# Patient Record
Sex: Male | Born: 1955 | Race: White | Hispanic: No | Marital: Married | Smoking: Current every day smoker
Health system: Southern US, Community
[De-identification: ages and names within clinical notes are randomized; demographics above are authoritative.]

## PROBLEM LIST (undated history)

## (undated) DIAGNOSIS — F1010 Alcohol abuse, uncomplicated: Secondary | ICD-10-CM

## (undated) DIAGNOSIS — E785 Hyperlipidemia, unspecified: Secondary | ICD-10-CM

## (undated) DIAGNOSIS — I10 Essential (primary) hypertension: Secondary | ICD-10-CM

## (undated) HISTORY — DX: Essential (primary) hypertension: I10

## (undated) HISTORY — DX: Hyperlipidemia, unspecified: E78.5

---

## 2012-05-02 ENCOUNTER — Ambulatory Visit (INDEPENDENT_AMBULATORY_CARE_PROVIDER_SITE_OTHER): Payer: PRIVATE HEALTH INSURANCE | Admitting: Physician Assistant

## 2012-05-02 ENCOUNTER — Encounter: Payer: Self-pay | Admitting: Physician Assistant

## 2012-05-02 VITALS — BP 169/100 | HR 80 | Ht 68.5 in | Wt 176.0 lb

## 2012-05-02 DIAGNOSIS — Z1211 Encounter for screening for malignant neoplasm of colon: Secondary | ICD-10-CM

## 2012-05-02 DIAGNOSIS — E785 Hyperlipidemia, unspecified: Secondary | ICD-10-CM

## 2012-05-02 DIAGNOSIS — I10 Essential (primary) hypertension: Secondary | ICD-10-CM

## 2012-05-02 MED ORDER — LISINOPRIL 10 MG PO TABS: 10.0000 mg | ORAL_TABLET | Freq: Every day | ORAL | Status: DC

## 2012-05-02 MED ORDER — PRAVASTATIN SODIUM 20 MG PO TABS: 20.0000 mg | ORAL_TABLET | Freq: Every day | ORAL | Status: DC

## 2012-05-02 MED ORDER — AMLODIPINE BESYLATE 10 MG PO TABS: 10.0000 mg | ORAL_TABLET | Freq: Every day | ORAL | Status: DC

## 2012-05-02 NOTE — Patient Instructions (Addendum)
Will refill medication and recheck blood pressure with nurse visit in 2 weeks. Refer for colonoscopy.  1.5 Gram Low Sodium Diet A 1.5 gram sodium diet restricts the amount of sodium in the diet to no more than 1.5 g or 1500 mg daily. The American Heart Association recommends Americans over the age of 82 to consume no more than 1500 mg of sodium each day to reduce the risk of developing high blood pressure. Research also shows that limiting sodium may reduce heart attack and stroke risk. Many foods contain sodium for flavor and sometimes as a preservative. When the amount of sodium in a diet needs to be low, it is important to know what to look for when choosing foods and drinks. The following includes some information and guidelines to help make it easier for you to adapt to a low sodium diet. QUICK TIPS  Do not add salt to food.   Avoid convenience items and fast food.   Choose unsalted snack foods.   Buy lower sodium products, often labeled as "lower sodium" or "no salt added."   Check food labels to learn how much sodium is in 1 serving.   When eating at a restaurant, ask that your food be prepared with less salt or none, if possible.  READING FOOD LABELS FOR SODIUM INFORMATION The nutrition facts label is a good place to find how much sodium is in foods. Look for products with no more than 400 mg of sodium per serving. Remember that 1.5 g = 1500 mg. The food label may also list foods as:  Sodium-free: Less than 5 mg in a serving.   Very low sodium: 35 mg or less in a serving.   Low-sodium: 140 mg or less in a serving.   Light in sodium: 50% less sodium in a serving. For example, if a food that usually has 300 mg of sodium is changed to become light in sodium, it will have 150 mg of sodium.   Reduced sodium: 25% less sodium in a serving. For example, if a food that usually has 400 mg of sodium is changed to reduced sodium, it will have 300 mg of sodium.  CHOOSING  FOODS Grains  Avoid: Salted crackers and snack items. Some cereals, including instant hot cereals. Bread stuffing and biscuit mixes. Seasoned rice or pasta mixes.   Choose: Unsalted snack items. Low-sodium cereals, oats, puffed wheat and rice, shredded wheat. English muffins and bread. Pasta.  Meats  Avoid: Salted, canned, smoked, spiced, pickled meats, including fish and poultry. Bacon, ham, sausage, cold cuts, hot dogs, anchovies.   Choose: Low-sodium canned tuna and salmon. Fresh or frozen meat, poultry, and fish.  Dairy  Avoid: Processed cheese and spreads. Cottage cheese. Buttermilk and condensed milk. Regular cheese.   Choose: Milk. Low-sodium cottage cheese. Yogurt. Sour cream. Low-sodium cheese.  Fruits and Vegetables  Avoid: Regular canned vegetables. Regular canned tomato sauce and paste. Frozen vegetables in sauces. Olives. Rosita Fire. Relishes. Sauerkraut.   Choose: Low-sodium canned vegetables. Low-sodium tomato sauce and paste. Frozen or fresh vegetables. Fresh and frozen fruit.  Condiments  Avoid: Canned and packaged gravies. Worcestershire sauce. Tartar sauce. Barbecue sauce. Soy sauce. Steak sauce. Ketchup. Onion, garlic, and table salt. Meat flavorings and tenderizers.   Choose: Fresh and dried herbs and spices. Low-sodium varieties of mustard and ketchup. Lemon juice. Tabasco sauce. Horseradish.  SAMPLE 1.5 GRAM SODIUM MEAL PLAN Breakfast / Sodium (mg)  1 cup low-fat milk / 143 mg   1 whole-wheat English muffin /  240 mg   1 tbs heart-healthy margarine / 153 mg   1 hard-boiled egg / 139 mg   1 small orange / 0 mg  Lunch / Sodium (mg)  1 cup raw carrots / 76 mg   2 tbs no salt added peanut butter / 5 mg   2 slices whole-wheat bread / 270 mg   1 tbs jelly / 6 mg    cup red grapes / 2 mg  Dinner / Sodium (mg)  1 cup whole-wheat pasta / 2 mg   1 cup low-sodium tomato sauce / 73 mg   3 oz lean ground beef / 57 mg   1 small side salad (1 cup raw  spinach leaves,  cup cucumber,  cup yellow bell pepper) with 1 tsp olive oil and 1 tsp red wine vinegar / 25 mg  Snack / Sodium (mg)  1 container low-fat vanilla yogurt / 107 mg   3 graham cracker squares / 127 mg  Nutrient Analysis  Calories: 1745   Protein: 75 g   Carbohydrate: 237 g   Fat: 57 g   Sodium: 1425 mg  Document Released: 10/17/2005 Document Revised: 10/06/2011 Document Reviewed: 01/18/2010 [facility] Patient Information 2012 [place], [place].

## 2012-05-04 DIAGNOSIS — I10 Essential (primary) hypertension: Secondary | ICD-10-CM | POA: Insufficient documentation

## 2012-05-04 DIAGNOSIS — E785 Hyperlipidemia, unspecified: Secondary | ICD-10-CM | POA: Insufficient documentation

## 2012-05-04 NOTE — Progress Notes (Signed)
  Subjective:    Patient ID: Mario Gonzales, male    DOB: 1956/02/11, 56 y.o.   MRN: [id number]  HPI Patient presents to clinic to establish care. PMH was reviewed and + for HTN and Hyperlipidemia. Patient has not had medications in last 2-3 weeks. BP is very elevated today. Pt denies any CP, Palpitations, SOB, or HA's. He has not had cholesterol checked in over a year. Needs colonoscopy. NO other problems or concerns today. Needs CPE. Pt is a smoker 3/4 pak a day for 13 years.    Review of Systems     Objective:   Physical Exam  Constitutional: She is oriented to person, place, and time. She appears well-developed and well-nourished.  HENT:  Head: Normocephalic and atraumatic.  Cardiovascular: Normal rate, regular rhythm, normal heart sounds and intact distal pulses.   Pulmonary/Chest: Effort normal and breath sounds normal. She has no wheezes.  Neurological: She is alert and oriented to person, place, and time.  Skin: Skin is warm and dry.  Psychiatric: She has a normal mood and affect. Her behavior is normal.          Assessment & Plan:  HTN- REfilled BP meds today. Gave handout on low salt diet. Encouraged patient to increase exercise and maintain a healthy diet. Will recheck with nurse visit in 2 weeks for bp. Educated pt that stoping smoking could also help with HTN.  Hyperlipidemia- NOt been on Pravachol for a couple of weeks. Refilled Pravachol. Will recheck lipids at CPE in month or so. Discussed how stopping smoking could help decrease cholesterol.   Needs to schedule CPE. Referred for colonoscopy.

## 2012-05-18 ENCOUNTER — Ambulatory Visit (INDEPENDENT_AMBULATORY_CARE_PROVIDER_SITE_OTHER): Payer: PRIVATE HEALTH INSURANCE | Admitting: Physician Assistant

## 2012-05-18 VITALS — BP 130/79 | HR 73

## 2012-05-18 DIAGNOSIS — I10 Essential (primary) hypertension: Secondary | ICD-10-CM

## 2012-05-18 NOTE — Progress Notes (Signed)
  Subjective:    Patient ID: Mario Gonzales, male    DOB: [birth year], 56 y.o.   MRN: [id number] BP check. No c/o SOB, chest pain or headache. Tolerating meds HPI    Review of Systems     Objective:   Physical Exam        Assessment & Plan:  Hypertension- Looking good. Follow up in 6 months. Continue on all meds. Tandy Gaw PA-C

## 2012-05-18 NOTE — Patient Instructions (Signed)
Continue on all meds. Follow up in 6 months.

## 2012-06-25 ENCOUNTER — Other Ambulatory Visit: Payer: Self-pay | Admitting: Physician Assistant

## 2012-08-29 ENCOUNTER — Other Ambulatory Visit: Payer: Self-pay | Admitting: Family Medicine

## 2012-08-29 ENCOUNTER — Other Ambulatory Visit: Payer: Self-pay | Admitting: Physician Assistant

## 2012-10-05 ENCOUNTER — Other Ambulatory Visit: Payer: Self-pay | Admitting: Physician Assistant

## 2012-12-10 ENCOUNTER — Other Ambulatory Visit: Payer: Self-pay | Admitting: Physician Assistant

## 2012-12-12 ENCOUNTER — Ambulatory Visit: Payer: PRIVATE HEALTH INSURANCE | Admitting: Physician Assistant

## 2012-12-14 ENCOUNTER — Encounter: Payer: Self-pay | Admitting: Physician Assistant

## 2012-12-14 ENCOUNTER — Ambulatory Visit (INDEPENDENT_AMBULATORY_CARE_PROVIDER_SITE_OTHER): Payer: PRIVATE HEALTH INSURANCE | Admitting: Physician Assistant

## 2012-12-14 VITALS — BP 142/84 | HR 88 | Wt 175.0 lb

## 2012-12-14 DIAGNOSIS — I10 Essential (primary) hypertension: Secondary | ICD-10-CM

## 2012-12-14 DIAGNOSIS — Z23 Encounter for immunization: Secondary | ICD-10-CM

## 2012-12-14 DIAGNOSIS — Z79899 Other long term (current) drug therapy: Secondary | ICD-10-CM

## 2012-12-14 DIAGNOSIS — E785 Hyperlipidemia, unspecified: Secondary | ICD-10-CM

## 2012-12-14 MED ORDER — AMLODIPINE BESYLATE 10 MG PO TABS: 10.0000 mg | ORAL_TABLET | Freq: Every day | ORAL | Status: DC

## 2012-12-14 MED ORDER — LISINOPRIL 10 MG PO TABS: 10.0000 mg | ORAL_TABLET | Freq: Every day | ORAL | Status: DC

## 2012-12-14 MED ORDER — PRAVASTATIN SODIUM 20 MG PO TABS: 20.0000 mg | ORAL_TABLET | Freq: Every day | ORAL | Status: DC

## 2012-12-14 NOTE — Progress Notes (Signed)
  Subjective:    Patient ID: Mario Gonzales, male    DOB: 1955-12-14, 57 y.o.   MRN: [id number]  Hypertension This is a chronic problem. The current episode started more than 1 year ago. The problem has been resolved since onset. The problem is controlled. Pertinent negatives include no anxiety, blurred vision, chest pain, headaches, malaise/fatigue, neck pain, orthopnea, palpitations, peripheral edema, PND, shortness of breath or sweats. Risk factors for coronary artery disease include dyslipidemia, male gender, smoking/tobacco exposure and sedentary lifestyle. Past treatments include ACE inhibitors and calcium channel blockers. The current treatment provides significant improvement. There are no compliance problems.  There is no history of angina, kidney disease, CAD/MI or heart failure. There is no history of chronic renal disease.  Hyperlipidemia This is a chronic problem. The current episode started more than 1 year ago. Condition status: Unknown no recent labs. Lipid results: Needs labs. He has no history of chronic renal disease, diabetes, hypothyroidism or obesity. Factors aggravating his hyperlipidemia include smoking. Pertinent negatives include no chest pain, focal sensory loss, focal weakness, leg pain, myalgias or shortness of breath. Current antihyperlipidemic treatment includes statins. The current treatment provides significant improvement of lipids. Compliance problems include adherence to diet and adherence to exercise.  Risk factors for coronary artery disease include dyslipidemia, male sex, hypertension and a sedentary lifestyle.      Review of Systems  Constitutional: Negative for malaise/fatigue.  HENT: Negative for neck pain.   Eyes: Negative for blurred vision.  Respiratory: Negative for shortness of breath.   Cardiovascular: Negative for chest pain, palpitations, orthopnea and PND.  Musculoskeletal: Negative for myalgias.  Neurological: Negative for focal weakness and  headaches.       Objective:   Physical Exam  Constitutional: He is oriented to person, place, and time. He appears well-developed and well-nourished.  HENT:  Head: Normocephalic and atraumatic.  Cardiovascular: Normal rate, regular rhythm and normal heart sounds.   Pulmonary/Chest: Effort normal and breath sounds normal. He has no wheezes.  Neurological: He is alert and oriented to person, place, and time. He has normal reflexes.  Skin: Skin is warm and dry.  NO swelling of extremities.   Psychiatric: He has a normal mood and affect. His behavior is normal.          Assessment & Plan:  Hypertension-Need to check CMP. Refilled meds for 6 months. Reminded of low salt diet.   Hyperlipidemia-Need to recheck cholesterol.Jovita Gamma lab slip. Refilled meds for 6 months.    Tdap given without any complications. Discussed with patient need for colonoscopy. Pt will check with insurance.

## 2012-12-14 NOTE — Patient Instructions (Signed)
Need labs drawn. Will call with results.   Follow up in 6 months.   Find out about colonoscopy if will go i will referral.

## 2012-12-28 LAB — COMPLETE METABOLIC PANEL WITH GFR
ALT: 22 U/L (ref 0–53)
AST: 28 U/L (ref 0–37)
Albumin: 4.7 g/dL (ref 3.5–5.2)
Calcium: 9.3 mg/dL (ref 8.4–10.5)
Chloride: 105 mEq/L (ref 96–112)
Creat: 0.78 mg/dL (ref 0.50–1.35)
Potassium: 4.5 mEq/L (ref 3.5–5.3)
Sodium: 140 mEq/L (ref 135–145)
Total Protein: 7.3 g/dL (ref 6.0–8.3)

## 2012-12-28 LAB — LIPID PANEL
LDL Cholesterol: 103 mg/dL — ABNORMAL HIGH (ref 0–99)
Triglycerides: 95 mg/dL (ref <150)
VLDL: 19 mg/dL (ref 0–40)

## 2013-05-10 ENCOUNTER — Ambulatory Visit (INDEPENDENT_AMBULATORY_CARE_PROVIDER_SITE_OTHER): Payer: PRIVATE HEALTH INSURANCE

## 2013-05-10 ENCOUNTER — Encounter: Payer: Self-pay | Admitting: Physician Assistant

## 2013-05-10 ENCOUNTER — Ambulatory Visit (INDEPENDENT_AMBULATORY_CARE_PROVIDER_SITE_OTHER): Payer: PRIVATE HEALTH INSURANCE | Admitting: Physician Assistant

## 2013-05-10 VITALS — BP 127/74 | HR 77 | Wt 174.0 lb

## 2013-05-10 DIAGNOSIS — M542 Cervicalgia: Secondary | ICD-10-CM

## 2013-05-10 DIAGNOSIS — M5030 Other cervical disc degeneration, unspecified cervical region: Secondary | ICD-10-CM

## 2013-05-10 NOTE — Progress Notes (Signed)
  Subjective:    Patient ID: Mario Gonzales, male    DOB: Jan 28, 1956, 57 y.o.   MRN: [id number]  HPI Patient presents to the clinic with 5-6 months of right sided neck pain. Denies any known trauma. Feels like pain and stiffness is getting worse. He rates the pain 1-2 out of 10. Describes the pain at dull and achy. Made worst with neck movement to the right. No pain with touching neck. He has not tried anything to make better. Denies any numbness or tingling down arm. He does have an occasional headache. No fever, chills.      Review of Systems     Objective:   Physical Exam  Constitutional: He appears well-developed and well-nourished.  Musculoskeletal:  No pain with palpation over C-spine. ROM of neck is normal with pain when turn head to the right. Strength 5/5. Normal ROM at shoulder without pain.          Assessment & Plan:  Right sided neck pain- suspect pulled muscle and inflammation and maybe some degenerative changes. Will get xrays of neck. Gave handout of stretches and discussed regular icing. Did give samples of celebrex for one week then use ibuprofen for neck pain. If not improving in next 2 weeks could consider muscle relaxer or formal PT. Call if not improving.

## 2013-06-20 ENCOUNTER — Encounter: Payer: Self-pay | Admitting: *Deleted

## 2013-06-26 ENCOUNTER — Telehealth: Payer: Self-pay | Admitting: Physician Assistant

## 2013-06-26 NOTE — Telephone Encounter (Signed)
Pt called and left a message for someone to call him back about his test results and meds

## 2013-06-27 ENCOUNTER — Other Ambulatory Visit: Payer: Self-pay | Admitting: Physician Assistant

## 2013-06-27 ENCOUNTER — Telehealth: Payer: Self-pay | Admitting: *Deleted

## 2013-06-27 MED ORDER — MELOXICAM 15 MG PO TABS: 15.0000 mg | ORAL_TABLET | Freq: Every day | ORAL | Status: DC

## 2013-06-27 NOTE — Telephone Encounter (Signed)
Pt calls and states that you gave him Celebrex for his neck pain and would like a prescription sent to [facility] in [place]. Barry Dienes, LPN

## 2013-06-27 NOTE — Telephone Encounter (Signed)
I don't think insurance will pay for celebrex unless failed mobic. I will send to pharmacy. See if this works as well. It is only once a day. ONly use as needed.

## 2013-06-28 NOTE — Telephone Encounter (Signed)
Pt notified of med change and instructions. Barry Dienes, LPN

## 2013-07-10 ENCOUNTER — Other Ambulatory Visit: Payer: Self-pay | Admitting: Physician Assistant

## 2013-07-12 ENCOUNTER — Other Ambulatory Visit: Payer: Self-pay | Admitting: Physician Assistant

## 2013-08-28 ENCOUNTER — Other Ambulatory Visit: Payer: Self-pay | Admitting: Physician Assistant

## 2013-10-14 ENCOUNTER — Other Ambulatory Visit: Payer: Self-pay | Admitting: *Deleted

## 2013-10-14 MED ORDER — AMLODIPINE BESYLATE 10 MG PO TABS: ORAL_TABLET | ORAL | Status: DC

## 2013-10-14 MED ORDER — PRAVASTATIN SODIUM 20 MG PO TABS: ORAL_TABLET | ORAL | Status: DC

## 2013-10-14 MED ORDER — LISINOPRIL 10 MG PO TABS: ORAL_TABLET | ORAL | Status: DC

## 2013-11-20 ENCOUNTER — Other Ambulatory Visit: Payer: Self-pay | Admitting: Physician Assistant

## 2013-12-31 ENCOUNTER — Other Ambulatory Visit: Payer: Self-pay | Admitting: *Deleted

## 2013-12-31 MED ORDER — AMLODIPINE BESYLATE 10 MG PO TABS: ORAL_TABLET | ORAL | Status: DC

## 2013-12-31 MED ORDER — PRAVASTATIN SODIUM 20 MG PO TABS: ORAL_TABLET | ORAL | Status: DC

## 2013-12-31 MED ORDER — LISINOPRIL 10 MG PO TABS: ORAL_TABLET | ORAL | Status: DC

## 2014-02-05 ENCOUNTER — Other Ambulatory Visit: Payer: Self-pay | Admitting: Physician Assistant

## 2014-04-11 ENCOUNTER — Ambulatory Visit (INDEPENDENT_AMBULATORY_CARE_PROVIDER_SITE_OTHER): Payer: PRIVATE HEALTH INSURANCE | Admitting: Physician Assistant

## 2014-04-11 ENCOUNTER — Encounter: Payer: Self-pay | Admitting: Physician Assistant

## 2014-04-11 VITALS — BP 152/93 | HR 76 | Ht 68.5 in | Wt 174.0 lb

## 2014-04-11 DIAGNOSIS — F17200 Nicotine dependence, unspecified, uncomplicated: Secondary | ICD-10-CM

## 2014-04-11 DIAGNOSIS — F1010 Alcohol abuse, uncomplicated: Secondary | ICD-10-CM

## 2014-04-11 DIAGNOSIS — I10 Essential (primary) hypertension: Secondary | ICD-10-CM

## 2014-04-11 DIAGNOSIS — Z720 Tobacco use: Secondary | ICD-10-CM

## 2014-04-11 DIAGNOSIS — E785 Hyperlipidemia, unspecified: Secondary | ICD-10-CM

## 2014-04-11 MED ORDER — AMLODIPINE BESYLATE 10 MG PO TABS: ORAL_TABLET | ORAL | Status: DC

## 2014-04-11 MED ORDER — PRAVASTATIN SODIUM 20 MG PO TABS: ORAL_TABLET | ORAL | Status: DC

## 2014-04-14 DIAGNOSIS — F1010 Alcohol abuse, uncomplicated: Secondary | ICD-10-CM | POA: Insufficient documentation

## 2014-04-14 DIAGNOSIS — Z720 Tobacco use: Secondary | ICD-10-CM | POA: Insufficient documentation

## 2014-04-14 NOTE — Progress Notes (Signed)
   Subjective:    Patient ID: Mario Gonzales, male    DOB: 11-Oct-1956, 58 y.o.   MRN: [id number]  HPI Patient is a 58 year old male who presents to the clinic to start back on his medications. She's not been on any medications for the last 3 months. He was laid off and could not afford his medications were office visits. He denies any chest pains, palpitations, headache or vision changes. He has not been checking his blood pressure regularly. He continues to smoke three fourths of a pack daily. He has no intentions of quitting. He does admit to drinking every night.    Review of Systems  All other systems reviewed and are negative.      Objective:   Physical Exam  Constitutional: He is oriented to person, place, and time. He appears well-developed and well-nourished.  HENT:  Head: Normocephalic and atraumatic.  Cardiovascular: Normal rate, regular rhythm and normal heart sounds.   Pulmonary/Chest: Effort normal and breath sounds normal.  Neurological: He is alert and oriented to person, place, and time.  Psychiatric: He has a normal mood and affect. His behavior is normal.          Assessment & Plan:  Hypertension-obviously blood pressure would be elevated today. He just started back on one medication to make sure would not be effective. Patient was started back on Norvasc 10 mg daily. Recheck in 4 weeks for blood pressure as well as addition of other medications to control.  Hyperlipidemia-started patient back on Pravachol 20 mg. It would not make any sense to check levels today since patient has not been on medication. Discuss the next 3 months should check lipid panel.  Tobacco abuse-discuss smoking and harmful effects. Patient has no intention or motivation to quit smoking.  Alcohol abuse/depression-patient does admit to drinking 2-3 glasses of bourbon every night. He has done this since his daughter died in 02/25/11. He continues to work and hold down a job. Discussed counseling  and patient was not interested. He does not want to be on medication at this time. Will check cmp at next visit.   Encouraged patient to schedule a complete physical.   Discussed need for colonoscopy. Patient is fair adamant he does not want colonoscopy. Discussed stool cards and patient said he would consider. Stool cards given today.

## 2014-07-09 ENCOUNTER — Other Ambulatory Visit: Payer: Self-pay | Admitting: Physician Assistant

## 2014-07-09 ENCOUNTER — Emergency Department (HOSPITAL_BASED_OUTPATIENT_CLINIC_OR_DEPARTMENT_OTHER)
Admission: EM | Admit: 2014-07-09 | Discharge: 2014-07-10 | Disposition: A | Discharge status: Home / Self Care | Payer: PRIVATE HEALTH INSURANCE | Attending: Emergency Medicine | Admitting: Emergency Medicine

## 2014-07-09 ENCOUNTER — Encounter (HOSPITAL_BASED_OUTPATIENT_CLINIC_OR_DEPARTMENT_OTHER): Payer: Self-pay | Admitting: Emergency Medicine

## 2014-07-09 ENCOUNTER — Emergency Department (HOSPITAL_BASED_OUTPATIENT_CLINIC_OR_DEPARTMENT_OTHER): Payer: PRIVATE HEALTH INSURANCE

## 2014-07-09 DIAGNOSIS — E785 Hyperlipidemia, unspecified: Secondary | ICD-10-CM | POA: Insufficient documentation

## 2014-07-09 DIAGNOSIS — Z79899 Other long term (current) drug therapy: Secondary | ICD-10-CM | POA: Diagnosis not present

## 2014-07-09 DIAGNOSIS — F17200 Nicotine dependence, unspecified, uncomplicated: Secondary | ICD-10-CM | POA: Insufficient documentation

## 2014-07-09 DIAGNOSIS — R319 Hematuria, unspecified: Secondary | ICD-10-CM | POA: Diagnosis present

## 2014-07-09 DIAGNOSIS — I10 Essential (primary) hypertension: Secondary | ICD-10-CM | POA: Insufficient documentation

## 2014-07-09 DIAGNOSIS — R310 Gross hematuria: Secondary | ICD-10-CM | POA: Insufficient documentation

## 2014-07-09 HISTORY — DX: Alcohol abuse, uncomplicated: F10.10

## 2014-07-09 LAB — URINALYSIS, ROUTINE W REFLEX MICROSCOPIC
BILIRUBIN URINE: NEGATIVE
GLUCOSE, UA: NEGATIVE mg/dL
KETONES UR: 15 mg/dL — AB
Nitrite: NEGATIVE
PH: 7 (ref 5.0–8.0)
Protein, ur: 30 mg/dL — AB
Specific Gravity, Urine: 1.005 (ref 1.005–1.030)
Urobilinogen, UA: 1 mg/dL (ref 0.0–1.0)

## 2014-07-09 LAB — URINE MICROSCOPIC-ADD ON

## 2014-07-09 NOTE — ED Notes (Signed)
MD at bedside. 

## 2014-07-09 NOTE — ED Notes (Signed)
C/o dark urine x 3 days-today obvious blood in urine

## 2014-07-09 NOTE — ED Provider Notes (Signed)
CSN: [id number]     Arrival date & time 07/09/14  2128 History  This chart was scribed for Wynetta Fines, MD by Jeanell Sparrow, ED Scribe. This patient was seen in room MH05/MH05 and the patient's care was started at 11:17 PM.   Chief Complaint  Patient presents with  . Hematuria   The history is provided by the patient. No language interpreter was used.   HPI Comments: Mario Gonzales is a 58 y.o. male who presents to the Emergency Department complaining of hematuria. He states that his urine has been dark the last few days. He reports that this morning he noticed gross blood in his urine. He denies any pain, fever, chills, nausea, or emesis. The symptoms are somewhat improved with increased hydration.   Past Medical History  Diagnosis Date  . Hypertension   . Hyperlipidemia   . ETOH abuse    History reviewed. No pertinent past surgical history. Family History  Problem Relation Age of Onset  . Heart attack Daughter   . Stroke Daughter    History  Substance Use Topics  . Smoking status: Current Every Day Smoker -- 0.80 packs/day    Types: Cigarettes  . Smokeless tobacco: Not on file  . Alcohol Use: Yes     Comment: daily    Review of Systems A complete 10 system review of systems was obtained and all systems are negative except as noted in the HPI and PMH.   Allergies  Mobic  Home Medications   Prior to Admission medications   Medication Sig Start Date End Date Taking? Authorizing Provider  amLODipine (NORVASC) 10 MG tablet TAKE ONE TABLET BY MOUTH ONCE DAILY 07/09/14   Jade L Breeback, PA-C  pravastatin (PRAVACHOL) 20 MG tablet TAKE ONE TABLET BY MOUTH ONCE DAILY 04/11/14   Jade L Breeback, PA-C   BP 160/87  Pulse 85  Temp(Src) 98.4 F (36.9 C) (Oral)  Resp 18  Ht 5\' 10"  (1.778 m)  Wt 175 lb (79.379 kg)  BMI 25.11 kg/m2  SpO2 98% Physical Exam  Nursing note and vitals reviewed.  General: Well-developed, well-nourished male in no acute distress; appearance  consistent with age of record HENT: normocephalic; atraumatic Eyes: pupils equal, round and reactive to light; extraocular muscles intact Neck: supple Heart: regular rate and rhythm Lungs: clear to auscultation bilaterally Abdomen: soft; nondistended; nontender; no masses or hepatosplenomegaly; bowel sounds present GU: no CVA tenderness Extremities: No deformity; full range of motion; pulses normal Neurologic: Awake, alert and oriented; motor function intact in all extremities and symmetric; no facial droop Skin: Warm and dry Psychiatric: Normal mood and affect  ED Course  Procedures (including critical care time) DIAGNOSTIC STUDIES: Oxygen Saturation is 98% on RA, normal by my interpretation.    COORDINATION OF CARE: 11:21 PM- Pt advised of plan for treatment which includes labs and pt agrees.  Nursing notes and vitals signs, including pulse oximetry, reviewed.  Summary of this visit's results, reviewed by myself:  Labs:  Results for orders placed during the hospital encounter of 07/09/14 (from the past 24 hour(s))  URINALYSIS, ROUTINE W REFLEX MICROSCOPIC     Status: Abnormal   Collection Time    07/09/14  9:47 PM      Result Value Ref Range   Color, Urine RED (*) YELLOW   APPearance CLEAR  CLEAR   Specific Gravity, Urine 1.005  1.005 - 1.030   pH 7.0  5.0 - 8.0   Glucose, UA NEGATIVE  NEGATIVE mg/dL  Hgb urine dipstick LARGE (*) NEGATIVE   Bilirubin Urine NEGATIVE  NEGATIVE   Ketones, ur 15 (*) NEGATIVE mg/dL   Protein, ur 30 (*) NEGATIVE mg/dL   Urobilinogen, UA 1.0  0.0 - 1.0 mg/dL   Nitrite NEGATIVE  NEGATIVE   Leukocytes, UA TRACE (*) NEGATIVE  URINE MICROSCOPIC-ADD ON     Status: None   Collection Time    07/09/14  9:47 PM      Result Value Ref Range   Squamous Epithelial / LPF RARE  RARE   WBC, UA 0-2  <3 WBC/hpf   RBC / HPF TOO NUMEROUS TO COUNT  <3 RBC/hpf   Bacteria, UA RARE  RARE    Imaging Studies: Ct Abdomen Pelvis Wo Contrast  07/10/2014    CLINICAL DATA:  Painless hematuria  EXAM: CT ABDOMEN AND PELVIS WITHOUT CONTRAST  TECHNIQUE: Multidetector CT imaging of the abdomen and pelvis was performed following the standard protocol without IV contrast.  COMPARISON:  None.  FINDINGS: Minimal scarring cyst atelectasis lung bases.  Unenhanced liver, spleen, pancreas, and adrenal glands are within normal limits.  Gallbladder is underdistended. No intrahepatic or extrahepatic ductal dilatation.  6 mm posterior interpolar right renal calculus (series 2/ image 35). Left kidney is unremarkable. No hydronephrosis.  No evidence of bowel obstruction. Normal appendix. Extensive sigmoid diverticulosis, without associated inflammatory changes.  Atherosclerotic calcifications of the abdominal aorta and branch vessels.  No abdominopelvic ascites.  No suspicious abdominopelvic lymphadenopathy.  Prostate is unremarkable.  No ureteral or bladder calculi.  Bladder is mildly thick-walled but underdistended.  Degenerative changes of the visualized thoracolumbar spine.  IMPRESSION: 6 mm posterior interpolar right renal calculus.  No ureteral or bladder calculi.  No hydronephrosis.   Electronically Signed   By: Julian Hy M.D.   On: 07/10/2014 00:04   Patient and family advised of lab and x-ray findings. He was advised that because the cause of the hematuria is not obvious, but includes cancer, he will need followup with urology for further workup  I personally performed the services described in this documentation, which was scribed in my presence. The recorded information has been reviewed and is accurate.   Wynetta Fines, MD 07/10/14 [phone]

## 2014-07-10 NOTE — Discharge Instructions (Signed)
Hematuria Hematuria is blood in your urine. It can be caused by a bladder infection, kidney infection, prostate infection, kidney stone, or cancer of your urinary tract. Infections can usually be treated with medicine, and a kidney stone usually will pass through your urine. If neither of these is the cause of your hematuria, further workup to find out the reason may be needed. It is very important that you tell your health care provider about any blood you see in your urine, even if the blood stops without treatment or happens without causing pain. Blood in your urine that happens and then stops and then happens again can be a symptom of a very serious condition. Also, pain is not a symptom in the initial stages of many urinary cancers. HOME CARE INSTRUCTIONS   Drink lots of fluid, 3-4 quarts a day. If you have been diagnosed with an infection, cranberry juice is especially recommended, in addition to large amounts of water.  Avoid caffeine, tea, and carbonated beverages because they tend to irritate the bladder.  Avoid alcohol because it may irritate the prostate.  Take all medicines as directed by your health care provider.  If you were prescribed an antibiotic medicine, finish it all even if you start to feel better.  If you have been diagnosed with a kidney stone, follow your health care provider's instructions regarding straining your urine to catch the stone.  Empty your bladder often. Avoid holding urine for long periods of time. SEEK MEDICAL CARE IF:  You develop back pain.  You have a fever.  You have a feeling of sickness in your stomach (nausea) or vomiting.  You are unable to urinate. MAKE SURE YOU:   Understand these instructions.  Will watch your condition.  Will get help right away if you are not doing well or get worse. Document Released: 10/17/2005 Document Revised: 03/03/2014 Document Reviewed: 06/17/2013 [facility] Patient Information 2015 [place], [place]. This  information is not intended to replace advice given to you by your health care provider. Make sure you discuss any questions you have with your health care provider.

## 2014-07-11 LAB — URINE CULTURE
COLONY COUNT: NO GROWTH
Culture: NO GROWTH

## 2014-08-04 ENCOUNTER — Other Ambulatory Visit: Payer: Self-pay | Admitting: Physician Assistant

## 2014-10-11 ENCOUNTER — Other Ambulatory Visit: Payer: Self-pay | Admitting: Physician Assistant

## 2014-12-06 ENCOUNTER — Other Ambulatory Visit: Payer: Self-pay | Admitting: Physician Assistant

## 2014-12-16 ENCOUNTER — Encounter: Payer: Self-pay | Admitting: Physician Assistant

## 2014-12-16 ENCOUNTER — Ambulatory Visit (INDEPENDENT_AMBULATORY_CARE_PROVIDER_SITE_OTHER): Payer: PRIVATE HEALTH INSURANCE | Admitting: Physician Assistant

## 2014-12-16 VITALS — BP 172/91 | HR 127 | Ht 70.0 in | Wt 174.0 lb

## 2014-12-16 DIAGNOSIS — Z79899 Other long term (current) drug therapy: Secondary | ICD-10-CM

## 2014-12-16 DIAGNOSIS — I10 Essential (primary) hypertension: Secondary | ICD-10-CM

## 2014-12-16 DIAGNOSIS — Z720 Tobacco use: Secondary | ICD-10-CM

## 2014-12-16 DIAGNOSIS — E785 Hyperlipidemia, unspecified: Secondary | ICD-10-CM

## 2014-12-16 MED ORDER — AMLODIPINE BESYLATE 10 MG PO TABS: 10.0000 mg | ORAL_TABLET | Freq: Every day | ORAL | Status: DC

## 2014-12-16 MED ORDER — PRAVASTATIN SODIUM 20 MG PO TABS: ORAL_TABLET | ORAL | Status: DC

## 2014-12-16 NOTE — Progress Notes (Signed)
   Subjective:    Patient ID: Mario Gonzales, male    DOB: Jan 14, 1956, 59 y.o.   MRN: [id number]  HPI Pt is a 59 yo male who presents to the clinic to get back on blood pressure and cholesterol medications. He has not been on due to not having insurance and not able to come in and get office visit. He denies any CP, palpitations, headaches, or vision changes.    Review of Systems  All other systems reviewed and are negative.      Objective:   Physical Exam  Constitutional: He is oriented to person, place, and time. He appears well-developed and well-nourished.  HENT:  Head: Normocephalic and atraumatic.  Cardiovascular: Normal rate, regular rhythm and normal heart sounds.   Pulmonary/Chest: Effort normal and breath sounds normal.  Neurological: He is alert and oriented to person, place, and time.  Skin: Skin is dry.  Psychiatric: He has a normal mood and affect. His behavior is normal.          Assessment & Plan:  HTN/tachycardia- very uncontrolled but not on medications. Started back on norvasc today. Must have nurse visit recheck in 4 weeks. We need a better pulse and BP. Discussed dash diet. Encouraged to stop smoking and limit drinking. cmp ordered today.   Tobacco abuse/alcohol abuse- pt not willing to quit at this time.   Hyperlipidemia- would not benefit to recheck at this time. Restart pravachol and recheck in 6 months. Discussed cholesterol diet.   Declined colonoscopy and flu shot.

## 2015-01-13 ENCOUNTER — Ambulatory Visit (INDEPENDENT_AMBULATORY_CARE_PROVIDER_SITE_OTHER): Payer: BLUE CROSS/BLUE SHIELD | Admitting: Physician Assistant

## 2015-01-13 DIAGNOSIS — I10 Essential (primary) hypertension: Secondary | ICD-10-CM | POA: Diagnosis not present

## 2015-01-13 MED ORDER — AMLODIPINE BESYLATE 10 MG PO TABS: 10.0000 mg | ORAL_TABLET | Freq: Every day | ORAL | Status: DC

## 2015-01-13 NOTE — Progress Notes (Signed)
   Subjective:    Patient ID: Mario Gonzales, male    DOB: Oct 04, 1956, 59 y.o.   MRN: [id number]  HPI  Mario Gonzales is here for a blood pressure check. He takes amlodipine daily for hypertension. Denies chest pain, shortness of breath or headaches.   Review of Systems     Objective:   Physical Exam        Assessment & Plan:  Hypertension - Patient's blood pressure is well controlled today. Advised to continue with current medications. Refilled for 6 months.

## 2015-06-07 ENCOUNTER — Other Ambulatory Visit: Payer: Self-pay | Admitting: Physician Assistant

## 2015-07-10 ENCOUNTER — Ambulatory Visit (INDEPENDENT_AMBULATORY_CARE_PROVIDER_SITE_OTHER): Payer: BLUE CROSS/BLUE SHIELD | Admitting: Physician Assistant

## 2015-07-10 ENCOUNTER — Encounter: Payer: Self-pay | Admitting: Physician Assistant

## 2015-07-10 VITALS — BP 129/71 | HR 82 | Ht 70.0 in | Wt 170.0 lb

## 2015-07-10 DIAGNOSIS — Z131 Encounter for screening for diabetes mellitus: Secondary | ICD-10-CM

## 2015-07-10 DIAGNOSIS — I10 Essential (primary) hypertension: Secondary | ICD-10-CM | POA: Diagnosis not present

## 2015-07-10 DIAGNOSIS — Z13220 Encounter for screening for lipoid disorders: Secondary | ICD-10-CM

## 2015-07-10 DIAGNOSIS — E785 Hyperlipidemia, unspecified: Secondary | ICD-10-CM | POA: Diagnosis not present

## 2015-07-10 MED ORDER — AMLODIPINE BESYLATE 10 MG PO TABS: 10.0000 mg | ORAL_TABLET | Freq: Every day | ORAL | Status: DC

## 2015-07-10 MED ORDER — PRAVASTATIN SODIUM 20 MG PO TABS: 20.0000 mg | ORAL_TABLET | Freq: Every day | ORAL | Status: DC

## 2015-07-10 NOTE — Progress Notes (Addendum)
   Subjective:    Patient ID: Mario Gonzales, male    DOB: Jan 12, 1956, 59 y.o.   MRN: [id number]  HPI  Patient presents to clinic for regular follow-up. BP well controlled with current medication regimen. Patient reports no side effects or concerns.  Review of Systems  Respiratory: Negative for cough and shortness of breath.   Cardiovascular: Negative for chest pain.  Neurological: Negative for dizziness.       Objective:   Physical Exam  Constitutional: Patient is alert, oriented, and cooperative. Not under any acute distress. Cardiac: S1, S2 auscultated, no M/G/R Pulm: CTAB      Assessment & Plan:  1) Hypertension- Well controlled with current regimen. Refill amlodipine 10 mg qd Filed Vitals:   07/10/15 1414  BP: 129/71  Pulse: 82   2) Hyperlipidemia- Pravastatin well tolerated per patient. Schedule fasting labs to check efficacy. Refill pravastatin 20 mg qd  3) CPE- Patient instructed to schedule appointment for CPE to discuss overdue health maintenance.   4) Need for colonoscopy. Pt declines. hemmoccult cards were given today to return for testing.

## 2015-08-26 LAB — COMPLETE METABOLIC PANEL WITH GFR
ALT: 27 U/L (ref 9–46)
AST: 35 U/L (ref 10–35)
Albumin: 4.4 g/dL (ref 3.6–5.1)
Alkaline Phosphatase: 82 U/L (ref 40–115)
BILIRUBIN TOTAL: 0.9 mg/dL (ref 0.2–1.2)
BUN: 10 mg/dL (ref 7–25)
CO2: 26 mmol/L (ref 20–31)
Calcium: 8.7 mg/dL (ref 8.6–10.3)
Chloride: 102 mmol/L (ref 98–110)
Creat: 0.64 mg/dL — ABNORMAL LOW (ref 0.70–1.33)
GFR, Est African American: >89 mL/min (ref >=60)
GLUCOSE: 72 mg/dL (ref 65–99)
Potassium: 4.6 mmol/L (ref 3.5–5.3)
SODIUM: 138 mmol/L (ref 135–146)
TOTAL PROTEIN: 7.2 g/dL (ref 6.1–8.1)

## 2015-08-26 LAB — LIPID PANEL
Cholesterol: 171 mg/dL (ref 125–200)
HDL: 77 mg/dL (ref >=40)
LDL CALC: 72 mg/dL (ref <130)
Total CHOL/HDL Ratio: 2.2 Ratio (ref <=5.0)
Triglycerides: 108 mg/dL (ref <150)
VLDL: 22 mg/dL (ref <30)

## 2015-10-05 ENCOUNTER — Encounter: Payer: Self-pay | Admitting: Physician Assistant

## 2015-10-05 ENCOUNTER — Ambulatory Visit (INDEPENDENT_AMBULATORY_CARE_PROVIDER_SITE_OTHER): Payer: BLUE CROSS/BLUE SHIELD | Admitting: Physician Assistant

## 2015-10-05 VITALS — BP 153/83 | HR 96 | Ht 70.0 in | Wt 172.0 lb

## 2015-10-05 DIAGNOSIS — Z1211 Encounter for screening for malignant neoplasm of colon: Secondary | ICD-10-CM | POA: Diagnosis not present

## 2015-10-05 DIAGNOSIS — Z0000 Encounter for general adult medical examination without abnormal findings: Secondary | ICD-10-CM

## 2015-10-05 DIAGNOSIS — I10 Essential (primary) hypertension: Secondary | ICD-10-CM | POA: Diagnosis not present

## 2015-10-05 NOTE — Progress Notes (Signed)
Subjective:    Patient ID: Mario Gonzales, male    DOB: Sep 17, 1956, 59 y.o.   MRN: [id number]  HPI Pt is a 59 yo male who presents to the clinic for CPE. Patient has no concerns or complaints today. He denies any chest pain, palpitation, headaches, shortness of breath.   .. Active Ambulatory Problems    Diagnosis Date Noted  . Hypertension, essential, benign 05/04/2012  . Hyperlipidemia 05/04/2012  . Alcohol abuse 04/14/2014  . Tobacco abuse 04/14/2014   Resolved Ambulatory Problems    Diagnosis Date Noted  . No Resolved Ambulatory Problems   Past Medical History  Diagnosis Date  . Hypertension   . ETOH abuse    .Marland Kitchen Family History  Problem Relation Age of Onset  . Heart attack Daughter   . Stroke Daughter    .Marland Kitchen Social History   Social History  . Marital Status: Married    Spouse Name: N/A  . Number of Children: N/A  . Years of Education: N/A   Occupational History  . Not on file.   Social History Main Topics  . Smoking status: Current Every Day Smoker -- 0.80 packs/day    Types: Cigarettes  . Smokeless tobacco: Not on file  . Alcohol Use: Yes     Comment: daily  . Drug Use: No  . Sexual Activity: Not on file   Other Topics Concern  . Not on file   Social History Narrative      Review of Systems  All other systems reviewed and are negative.      Objective:   Physical Exam BP 150/80 mmHg  Pulse 97  Ht 5\' 10"  (1.778 m)  Wt 172 lb (78.019 kg)  BMI 24.68 kg/m2  General Appearance:    Alert, cooperative, no distress, appears stated age  Head:    Normocephalic, without obvious abnormality, atraumatic  Eyes:    PERRL, conjunctiva/corneas clear, EOM's intact, fundi    benign, both eyes       Ears:    Normal TM's and external ear canals, both ears  Nose:   Nares normal, septum midline, mucosa normal, no drainage    or sinus tenderness  Throat:   Lips, mucosa, and tongue normal; teeth and gums normal  Neck:   Supple, symmetrical, trachea  midline, no adenopathy;       thyroid:  No enlargement/tenderness/nodules; no carotid   bruit or JVD  Back:     Symmetric, no curvature, ROM normal, no CVA tenderness  Lungs:     Clear to auscultation bilaterally, respirations unlabored  Chest wall:    No tenderness or deformity  Heart:    Regular rate and rhythm, S1 and S2 normal, no murmur, rub   or gallop  Abdomen:     Soft, non-tender, bowel sounds active all four quadrants,    no masses, no organomegaly  Genitalia:  Pt declined  Rectal:  Pt declined  Extremities:   Extremities normal, atraumatic, no cyanosis or edema  Pulses:   2+ and symmetric all extremities  Skin:   Skin color, texture, turgor normal, no rashes or lesions  Lymph nodes:   Cervical, supraclavicular, and axillary nodes normal  Neurologic:   CNII-XII intact. Normal strength, sensation and reflexes      throughout         Assessment & Plan:  CPE- AUA was 4. Pt declined PSA and DRE. Pt aware of risk. Will refer for colonoscopy.needs on Monday. Pt declined flu shot. Encouraged mens MVI>  encouraged smoking cessation. Pt does not want to quit.   HTN- elevated today. Per pt consumed a lot of alcohol this weekend and thus onset. He checks occasionally and always under 140/90. We'll recheck at next blood pressure visit. Continue on Norvasc.

## 2015-10-05 NOTE — Patient Instructions (Signed)

## 2015-11-17 ENCOUNTER — Encounter: Payer: Self-pay | Admitting: *Deleted

## 2015-11-17 ENCOUNTER — Emergency Department (INDEPENDENT_AMBULATORY_CARE_PROVIDER_SITE_OTHER)
Admission: EM | Admit: 2015-11-17 | Discharge: 2015-11-17 | Disposition: A | Discharge status: Home / Self Care | Payer: BLUE CROSS/BLUE SHIELD | Source: Home / Self Care | Attending: Family Medicine | Admitting: Family Medicine

## 2015-11-17 DIAGNOSIS — I10 Essential (primary) hypertension: Secondary | ICD-10-CM | POA: Diagnosis not present

## 2015-11-17 DIAGNOSIS — F1010 Alcohol abuse, uncomplicated: Secondary | ICD-10-CM

## 2015-11-17 DIAGNOSIS — J069 Acute upper respiratory infection, unspecified: Secondary | ICD-10-CM | POA: Diagnosis not present

## 2015-11-17 DIAGNOSIS — B9789 Other viral agents as the cause of diseases classified elsewhere: Secondary | ICD-10-CM

## 2015-11-17 LAB — POCT CBC W AUTO DIFF (K'VILLE URGENT CARE)

## 2015-11-17 MED ORDER — HYDROCHLOROTHIAZIDE 12.5 MG PO TABS: 12.5000 mg | ORAL_TABLET | ORAL | Status: DC

## 2015-11-17 NOTE — ED Provider Notes (Signed)
CSN: [id number]     Arrival date & time 11/17/15  1501 History   First MD Initiated Contact with Patient 11/17/15 1508     Chief Complaint  Patient presents with  . Hypertension     HPI Comments: One week ago patient developed typical cold-like symptoms including mild sore throat, sinus congestion, headache, fatigue, and cough.  He has remained out of work during the past week.  He admits that he drinks alcohol (bourbon) to excess when is home from work.  He became alarmed when he found his BP at home today to be approximately 160/115 today.   He believes that his cold is improving.  He states that he normally drinks alcohol on weekends when he is out of work.  He also continues to smoke.  No chest pain, shortness of breath, or fevers, chills, and sweats.  Past Medical History  Diagnosis Date  . Hypertension   . Hyperlipidemia   . ETOH abuse    History reviewed. No pertinent past surgical history. Family History  Problem Relation Age of Onset  . Heart attack Daughter   . Stroke Daughter    Social History  Substance Use Topics  . Smoking status: Current Every Day Smoker -- 1.00 packs/day    Types: Cigarettes  . Smokeless tobacco: None  . Alcohol Use: Yes     Comment: daily    Review of Systems + sore throat + cough No pleuritic pain No wheezing + nasal congestion + post-nasal drainage No sinus pain/pressure No itchy/red eyes No earache No hemoptysis No  SOB No fever/chills No nausea No vomiting No abdominal pain No diarrhea No urinary symptoms No skin rash + fatigue + tremors No myalgias No headache Used OTC meds without relief  Allergies  Mobic  Home Medications   Prior to Admission medications   Medication Sig Start Date End Date Taking? Authorizing Provider  amLODipine (NORVASC) 10 MG tablet Take 1 tablet (10 mg total) by mouth daily. 07/10/15   Jade L Breeback, PA-C  aspirin 81 MG tablet Take 81 mg by mouth daily.    Historical Provider, MD  hydrochlorothiazide (HYDRODIURIL) 12.5 MG tablet Take 1 tablet (12.5 mg total) by mouth every morning. 11/17/15   Kandra Nicolas, MD  Multiple Vitamins-Minerals (MULTIVITAMIN WITH MINERALS) tablet Take 1 tablet by mouth daily.    Historical Provider, MD  pravastatin (PRAVACHOL) 20 MG tablet Take 1 tablet (20 mg total) by mouth daily. 07/10/15   Donella Stade, PA-C   Meds Ordered and Administered this Visit  Medications - No data to display  BP 177/109 mmHg  Pulse 133  Temp(Src) 98.1 F (36.7 C) (Oral)  Resp 20  Ht 5\' 11"  (1.803 m)  Wt 169 lb (76.658 kg)  BMI 23.58 kg/m2  SpO2 97% No data found.   Physical Exam Nursing notes and Vital Signs reviewed. Appearance:  Patient appears stated age, and in no acute distress.  He is alert and oriented.  A resting tremor is present  Eyes:  Pupils are equal, round, and reactive to light and accomodation.  Extraocular movement is intact.  Conjunctivae are not inflamed  Ears:  Canals normal.  Tympanic membranes normal.  Nose:  Mildly congested turbinates.  No sinus tenderness.    Pharynx:  Normal Neck:  Supple.  No adenopathy Lungs:  Clear to auscultation.  Breath sounds are equal.  Moving air well. Heart:  Regular rate and rhythm without murmurs, rubs, or gallops.  Abdomen:  Nontender without masses or hepatosplenomegaly.  Bowel sounds are present.  No CVA or flank tenderness.  Extremities:  No edema.  Skin:  No rash present.   ED  Course  Procedures  None    Labs Reviewed  COMPLETE METABOLIC PANEL WITH GFR  POCT CBC W AUTO DIFF ([facility]):  WBC 6.2; LY 32.9; MO 5.0; GR 62.1; Hgb 16.4; Platelets 134      MDM   1. Alcohol abuse   2. Hypertension, essential, benign   3. Viral URI with cough; no evidence bacterial infection    CMP pending. Continue Norvasc 10mg  daily.  Add HCTZ 12.5mg  QAM. Recommend checking BP daily and record on calendar Followup with PCP in one week.   Recommend alcohol addiction treatment at [facility] ([place]) locally.  Patient may call for appointment. If symptoms become significantly worse during the night  or over the weekend, proceed to the local emergency room.    Kandra Nicolas, MD 11/17/15 [phone]

## 2015-11-17 NOTE — ED Notes (Signed)
Pt c/o elevated BP's at home, approximately 160's/115 per pt's wife, x 2 days. The pt is very shaky. He reports that the shakes may be from drinking a little more since he has been out of work for a week. Reports mild HA.

## 2015-11-17 NOTE — Discharge Instructions (Signed)
Recommend alcohol addiction treatment at [facility] ([place]) locally.  Call for appointment. If symptoms become significantly worse during the night or over the weekend, proceed to the local emergency room.

## 2015-11-18 ENCOUNTER — Telehealth: Payer: Self-pay | Admitting: *Deleted

## 2015-11-18 LAB — COMPLETE METABOLIC PANEL WITH GFR
ALK PHOS: 106 U/L (ref 40–115)
ALT: 61 U/L — ABNORMAL HIGH (ref 9–46)
AST: 65 U/L — ABNORMAL HIGH (ref 10–35)
Albumin: 4.5 g/dL (ref 3.6–5.1)
BILIRUBIN TOTAL: 1 mg/dL (ref 0.2–1.2)
BUN: 14 mg/dL (ref 7–25)
CO2: 22 mmol/L (ref 20–31)
Calcium: 9.6 mg/dL (ref 8.6–10.3)
Chloride: 100 mmol/L (ref 98–110)
Creat: 0.65 mg/dL — ABNORMAL LOW (ref 0.70–1.33)
Glucose, Bld: 113 mg/dL — ABNORMAL HIGH (ref 65–99)
POTASSIUM: 3.7 mmol/L (ref 3.5–5.3)
Sodium: 136 mmol/L (ref 135–146)
TOTAL PROTEIN: 7.7 g/dL (ref 6.1–8.1)

## 2015-12-15 ENCOUNTER — Ambulatory Visit (INDEPENDENT_AMBULATORY_CARE_PROVIDER_SITE_OTHER): Payer: BLUE CROSS/BLUE SHIELD | Admitting: Physician Assistant

## 2015-12-15 ENCOUNTER — Encounter: Payer: Self-pay | Admitting: Physician Assistant

## 2015-12-15 VITALS — BP 128/75 | HR 76 | Ht 71.0 in | Wt 170.0 lb

## 2015-12-15 DIAGNOSIS — I10 Essential (primary) hypertension: Secondary | ICD-10-CM | POA: Diagnosis not present

## 2015-12-15 MED ORDER — AMLODIPINE BESYLATE 10 MG PO TABS: 10.0000 mg | ORAL_TABLET | Freq: Every day | ORAL | Status: DC

## 2015-12-15 MED ORDER — HYDROCHLOROTHIAZIDE 12.5 MG PO TABS: 12.5000 mg | ORAL_TABLET | ORAL | Status: DC

## 2015-12-15 NOTE — Progress Notes (Signed)
   Subjective:    Patient ID: Mario Gonzales, male    DOB: 1956/05/05, 60 y.o.   MRN: [id number]  HPI Pt presents to the clinic to follow up on BP. He was seen at urgent care on 11/17/15 and BP was elevated 177/109. He was given HCTZ 12.5mg  to start. He admits to being drunk for a full week and having an upper respiratory infection taking decongestants. He is not taking HCTZ daily just if he feels BP is elevated. Taking norvasc daily.Denies any CP, palpitations, headaches, vision changes, SOB.    Review of Systems  All other systems reviewed and are negative.      Objective:   Physical Exam  Constitutional: He is oriented to person, place, and time. He appears well-developed and well-nourished.  HENT:  Head: Normocephalic and atraumatic.  Cardiovascular: Normal rate, regular rhythm and normal heart sounds.   Pulmonary/Chest: Effort normal and breath sounds normal.  Neurological: He is alert and oriented to person, place, and time.  Psychiatric: He has a normal mood and affect. His behavior is normal.          Assessment & Plan:  Hypertension- controlled today. Looks great. norvasc and HCTZ refilled. Continue to monitor at home. Follow up in 6 months.

## 2015-12-21 LAB — HM COLONOSCOPY

## 2015-12-23 ENCOUNTER — Encounter: Payer: Self-pay | Admitting: Physician Assistant

## 2015-12-23 DIAGNOSIS — K5790 Diverticulosis of intestine, part unspecified, without perforation or abscess without bleeding: Secondary | ICD-10-CM | POA: Insufficient documentation

## 2015-12-24 ENCOUNTER — Encounter: Payer: Self-pay | Admitting: Physician Assistant

## 2015-12-25 ENCOUNTER — Encounter: Payer: Self-pay | Admitting: Physician Assistant

## 2015-12-25 DIAGNOSIS — D126 Benign neoplasm of colon, unspecified: Secondary | ICD-10-CM | POA: Insufficient documentation

## 2016-01-04 ENCOUNTER — Other Ambulatory Visit: Payer: Self-pay | Admitting: Physician Assistant

## 2016-02-03 ENCOUNTER — Other Ambulatory Visit: Payer: Self-pay | Admitting: Physician Assistant

## 2016-03-08 ENCOUNTER — Other Ambulatory Visit: Payer: Self-pay | Admitting: Physician Assistant

## 2016-04-03 ENCOUNTER — Other Ambulatory Visit: Payer: Self-pay | Admitting: Physician Assistant

## 2016-05-27 ENCOUNTER — Ambulatory Visit (INDEPENDENT_AMBULATORY_CARE_PROVIDER_SITE_OTHER): Payer: BLUE CROSS/BLUE SHIELD | Admitting: Physician Assistant

## 2016-05-27 ENCOUNTER — Encounter: Payer: Self-pay | Admitting: Physician Assistant

## 2016-05-27 ENCOUNTER — Ambulatory Visit (INDEPENDENT_AMBULATORY_CARE_PROVIDER_SITE_OTHER): Payer: BLUE CROSS/BLUE SHIELD

## 2016-05-27 VITALS — BP 144/93 | HR 82 | Ht 71.0 in | Wt 170.0 lb

## 2016-05-27 DIAGNOSIS — M19072 Primary osteoarthritis, left ankle and foot: Secondary | ICD-10-CM

## 2016-05-27 DIAGNOSIS — M7731 Calcaneal spur, right foot: Secondary | ICD-10-CM | POA: Diagnosis not present

## 2016-05-27 DIAGNOSIS — M79671 Pain in right foot: Secondary | ICD-10-CM

## 2016-05-27 DIAGNOSIS — M79672 Pain in left foot: Secondary | ICD-10-CM

## 2016-05-27 MED ORDER — NAPROXEN 500 MG PO TABS: 500.0000 mg | ORAL_TABLET | Freq: Two times a day (BID) | ORAL | 0 refills | Status: DC

## 2016-05-27 MED ORDER — OMEPRAZOLE 40 MG PO CPDR: 40.0000 mg | DELAYED_RELEASE_CAPSULE | Freq: Every day | ORAL | 3 refills | Status: DC

## 2016-05-27 NOTE — Patient Instructions (Addendum)
Make appt with sports med for orthotics.   Stress Fracture Stress fracture is a small break or crack in a bone. A stress fracture can be fully broken (complete) or partially broken (incomplete). The most common sites for stress fractures are the bones in the front of your feet (metatarsals), your heels (calcaneus), and the long bone of your lower leg (tibia). CAUSES A stress fracture is caused by overuse or repetitive exercise, such as running. It happens when a bone cannot absorb any more shock because the muscles around it are weak. Stress fractures happen most commonly when:  You rapidly increase or start a new physical activity.  You use shoes that are worn out or do not fit you properly.  You exercise on a new surface. RISK FACTORS You may be at higher risk for this type of fracture if:  You have a condition that causes weak bones (osteoporosis).  You are male. Stress fractures are more likely to occur in women. SIGNS AND SYMPTOMS The most common symptom of a stress fracture is feeling pain when you are using the affected part of your body. The pain usually goes away when you are resting. Other symptoms may include:  Swelling of the affected area.  Pain in the area when it is touched.  Decreased pain while resting. Stress fracture pain usually develops over time. DIAGNOSIS Diagnosis may include:   Medical history and physical exam.  X-rays.  Bone scan.  MRI. TREATMENT Treatment depends on the severity of your stress fracture. Treatment usually involves resting, icing, compression, and elevation (RICE) of the affected part of your body. Treatment may also include:  Medicines to reduce inflammation.  A cast or a walking shoe.  Crutches.  Surgery. HOME CARE INSTRUCTIONS If You Have a Cast:  Do not stick anything inside the cast to scratch your skin. Doing that increases your risk of infection.  Check the skin around the cast every day. Report any concerns to  your health care provider. You may put lotion on dry skin around the edges of the cast. Do not apply lotion to the skin underneath the cast.  Keep the cast clean and dry.  Cover the cast with a watertight plastic bag to protect it from water while you take a bath or a shower. Do not let the cast get wet.  Do not put pressure on any part of the cast until it is fully hardened. This may take several hours. If You Have a Walking Shoe:  Wear it as directed by your health care provider. Managing Pain, Stiffness, and Swelling  If directed, apply ice to the injured area:  Put ice in a plastic bag.  Place a towel between your skin and the bag.  Leave the ice on for 20 minutes, 2-3 times per day.  Move your fingers or toes often to avoid stiffness and to lessen swelling.  Raise the injured area above the level of your heart while you are sitting or lying down. Activity  Rest as directed by your health care provider. Ask your health care provider if you may do alternative exercises, such as swimming or biking, while you are healing.  Return to your normal activities as directed by your health care provider. Ask your health care provider what activities are safe for you.  Perform range-of-motion exercises only as directed by your health care provider. Safety  Do not use the injured limb to support yourbody weight until your health care provider says that you can. Use  crutches if your health care provider tells you to do so. General Instructions  Do not use any tobacco products, including cigarettes, chewing tobacco, or electronic cigarettes. Tobacco can delay bone healing. If you need help quitting, ask your health care provider.  Take medicines only as directed by your health care provider.  Keep all follow-up visits as directed by your health care provider. This is important. PREVENTION  Only wear shoes that:  Fit well.  Are not worn out.  Eat a healthy diet that contains  vitamin D and calcium. This helps keeps your bones strong.  Be careful when you start a new physical activity. Give your body time to adjust.  Avoid doing only one kind of activity. Do different exercises, such as swimming and running, so that no single part of your body gets overused.  Do strength-training exercises. SEEK MEDICAL CARE IF:  Your pain gets worse.  You have new symptoms.  You have increased swelling. SEEK IMMEDIATE MEDICAL CARE IF:   You lose feeling in the affected area.   This information is not intended to replace advice given to you by your health care provider. Make sure you discuss any questions you have with your health care provider.   Document Released: 01/07/2003 Document Revised: 11/07/2014 Document Reviewed: 05/22/2014 Elsevier Interactive Patient Education [employer].

## 2016-05-27 NOTE — Progress Notes (Addendum)
   Subjective:    Patient ID: Mario Gonzales, male    DOB: 05-Feb-1956, 60 y.o.   MRN: [id number]  HPI Pt is a 60 year old male with a CC of bilateral foot pain over the medial arches more dorsal than plantar for 2-3 months. He describes the pain as constant, throbbing and does not radiate. He reports the pain is worse right with standing and the pain improves after 4-5 steps. He works as Psychologist, forensic during the workday. He walks about 8-10 miles a day at work. He recalls 2 months ago going into a tight airplane compartment and having to uncomfortable go up a ladder. He denies any falls, numbness or tingling.   Review of Systems  All other systems reviewed and are negative.      Objective:   Physical Exam  Constitutional: He is oriented to person, place, and time. He appears well-developed and well-nourished.  HENT:  Head: Normocephalic and atraumatic.  Musculoskeletal: Normal range of motion.  Bilateral feet: Point tenderness to dorsum of foot over the medial arch.  No edema, erythema or warmth.  Strength 5/5.  No pain with palpation over fascia or heels.   Neurological: He is alert and oriented to person, place, and time.       Assessment & Plan:  Bilateral foot pain- XRay ordered. MTP OA but otherwise unremarkable. Possible stress fracture. Post op shoes to wear when not at work. Naproxen BID with omeprazole QD to help with GI tolerance. Hx of intolerance to mobic. Given Pennsaid samples today to apply BID for the next two weeks.encouraged patient to take next week off work or talk to HR to see if he could do light duty.  Referral to sport medicine for orthotics in work shoes. Follow up in 2 weeks.

## 2016-06-03 ENCOUNTER — Telehealth: Payer: Self-pay | Admitting: *Deleted

## 2016-06-03 NOTE — Telephone Encounter (Signed)
Pt's wife left vm stating that he is going to need a work note.

## 2016-06-03 NOTE — Telephone Encounter (Signed)
Oswaldo Milian already asked about this. Is this done?

## 2016-06-06 NOTE — Telephone Encounter (Signed)
I don't see anything under the letters tab in his chart.  I can go ahead and write the letter but what are the dates that he needs?

## 2016-06-06 NOTE — Telephone Encounter (Signed)
Mario Gonzales did it not in system but via note pad, I believe. Right, Mario Gonzales?

## 2016-06-07 ENCOUNTER — Telehealth: Payer: Self-pay

## 2016-06-07 NOTE — Telephone Encounter (Signed)
[employer] dropped off some FMLA paperwork for [employer] to fill out. She request that we fax the paperwork when completed and then call them to come pick copy.

## 2016-06-09 NOTE — Telephone Encounter (Signed)
Paperwork done. Please scan and call pt for copy.

## 2016-06-09 NOTE — Telephone Encounter (Signed)
Pt's wife notified to pick up FMLA forms.

## 2016-06-17 ENCOUNTER — Ambulatory Visit (INDEPENDENT_AMBULATORY_CARE_PROVIDER_SITE_OTHER): Payer: BLUE CROSS/BLUE SHIELD | Admitting: Physician Assistant

## 2016-06-17 ENCOUNTER — Encounter: Payer: Self-pay | Admitting: Physician Assistant

## 2016-06-17 VITALS — BP 131/89 | HR 116 | Wt 164.0 lb

## 2016-06-17 DIAGNOSIS — M79672 Pain in left foot: Secondary | ICD-10-CM

## 2016-06-17 DIAGNOSIS — M79671 Pain in right foot: Secondary | ICD-10-CM | POA: Diagnosis not present

## 2016-06-17 NOTE — Patient Instructions (Signed)
10:30 Monday for Dr. Darene Lamer.

## 2016-06-17 NOTE — Progress Notes (Signed)
   Subjective:    Patient ID: Mario Gonzales, male    DOB: June 08, 1956, 60 y.o.   MRN: [id number]  HPI  Pt is a 60 yo male who presents to the clinic for 2 week follow up on bilateral foot pain. Pain is still localized over the medial arches more dorsal than plantar. Xrays were negative for acute findings.  He was placed in post op shoe and given mobic for the last 2 weeks while being written out of work for likely stress fracture. Right foot is 70 percent better. Left foot is 10 percent better. He is still in pain with ambulation. Post-op shoe does make it better. Hewalkes 8-10 miles a day and does not feel like he can walk this until pain improves.     Review of Systems    see HPI.  Objective:   Physical Exam  Left foot:  NROM. No pain with dorsal or plantar flexion.  No edema, erythema or warmth.  No pain with palpation over fascia or heels.  Moderate tenderness to palpation over the dorsum of the foot over the medial arch.   Right foot:  NROM.  No edema, erythema or warmth.  No pain with palpation over fascia or heels.  Very minimal Point tenderness over the dorsum of the foot over the medial arch      Assessment & Plan:  Bilateral foot pain- unclear etiology. Does not appear to be the fascia of foot. Treated as stress fracture and right foot is 70 percent better. Left foot has not improved. Went ahead and wrote out of work for another week. Scheduled appt with Dr. Darene Lamer on Monday. Ice, Mobic, elevate, rest. Continue to stay in post-op shoe until Dr. Darene Lamer gives further plans.

## 2016-06-19 ENCOUNTER — Encounter: Payer: Self-pay | Admitting: Physician Assistant

## 2016-06-20 ENCOUNTER — Encounter: Payer: Self-pay | Admitting: Sports Medicine

## 2016-06-20 ENCOUNTER — Ambulatory Visit (INDEPENDENT_AMBULATORY_CARE_PROVIDER_SITE_OTHER): Payer: BLUE CROSS/BLUE SHIELD | Admitting: Sports Medicine

## 2016-06-20 DIAGNOSIS — G250 Essential tremor: Secondary | ICD-10-CM | POA: Diagnosis not present

## 2016-06-20 DIAGNOSIS — M19071 Primary osteoarthritis, right ankle and foot: Secondary | ICD-10-CM | POA: Diagnosis not present

## 2016-06-20 DIAGNOSIS — M19072 Primary osteoarthritis, left ankle and foot: Secondary | ICD-10-CM | POA: Diagnosis not present

## 2016-06-20 MED ORDER — PRIMIDONE 50 MG PO TABS: 50.0000 mg | ORAL_TABLET | Freq: Every day | ORAL | 3 refills | Status: DC

## 2016-06-20 MED ORDER — DICLOFENAC SODIUM 75 MG PO TBEC
75.0000 mg | DELAYED_RELEASE_TABLET | Freq: Two times a day (BID) | ORAL | 3 refills | Status: DC
Start: 2016-06-20 — End: 2016-10-27

## 2016-06-20 NOTE — Assessment & Plan Note (Signed)
Tremors partially due to alcohol withdrawal as well as benign essential tremor.  Adding primidone. Dose will need to be titrated up as needed. His head shake is what gives it away as an essential tremor

## 2016-06-20 NOTE — Assessment & Plan Note (Signed)
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 Adding Voltaren, he will return for custom molded orthotics.

## 2016-06-20 NOTE — Progress Notes (Signed)
   Subjective:    I'm seeing this patient as a consultation for:  Mario Gonzales   CC: foot pain   HPI: 60 yo with history of alcoholism presenting with three months of foot pain.  Pain is localized over medial cuneiform, L > R.  He says pain is sharp and throbbing, most pronounced when he is taking his first few steps in the morning and after sitting.  Pain does not radiate into his first metatarsal or up to his ankle.  He has tried aspirin for pain without relief.  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  He has been on bedrest for the past 3 weeks, which has somewhat relieved his symptoms, but they continue to be bothersome when he gets up to walk.  He is hoping we can provide him with a plan that will allow him to go back to work without experiencing pain.    Past medical history, Surgical history, Family history not pertinant except as noted below, Social history, Allergies, and medications have been entered into the medical record, reviewed, and no changes needed.   Review of Systems: No headache, visual changes, nausea, vomiting, diarrhea, constipation, dizziness, abdominal pain, skin rash, fevers, chills, night sweats, weight loss, swollen lymph nodes, body aches, joint swelling, muscle aches, chest pain, shortness of breath, mood changes, visual or auditory hallucinations.   Objective:   General: Well Developed, well nourished, and in no acute distress.  Neuro/Psych: Alert and oriented x3, extra-ocular muscles intact, able to move all 4 extremities, sensation grossly intact. Fine head tremor, fine tremor in bilateral hands.  Skin: Warm and dry, no rashes noted.  Respiratory: Not using accessory muscles, speaking in full sentences, trachea midline.  Cardiovascular: Pulses palpable, no extremity edema. Abdomen: Does not appear distended. R Foot: No visible erythema or swelling. Range of motion is full in all directions. Strength is 5/5 in all  directions. No hallux valgus. No pes cavus or pes planus. No abnormal callus noted. No pain over the navicular prominence, or base of fifth metatarsal. TTP over medial cuneiform.  No tenderness to palpation of the calcaneal insertion of plantar fascia. No pain at the Achilles insertion. No pain over the calcaneal bursa. No pain of the retrocalcaneal bursa. No tenderness to palpation over the tarsals, metatarsals, or phalanges. No hallux rigidus or limitus. No tenderness palpation over interphalangeal joints. No pain with compression of the metatarsal heads. Neurovascularly intact distally. L Foot: No visible erythema or swelling. Range of motion is full in all directions. Strength is 5/5 in all directions. No hallux valgus. No pes cavus or pes planus. No abnormal callus noted. No pain over the navicular prominence, or base of fifth metatarsal. TTP over medial cuneiform.  No tenderness to palpation of the calcaneal insertion of plantar fascia. No pain at the Achilles insertion. No pain over the calcaneal bursa. No pain of the retrocalcaneal bursa. No tenderness to palpation over the tarsals, metatarsals, or phalanges. No hallux rigidus or limitus. No tenderness palpation over interphalangeal joints. No pain with compression of the metatarsal heads. Neurovascularly intact distally.    Impression and Recommendations:   This case required medical decision making of moderate complexity.  1. Midfoot OA, bilateral over medial cuneiform/first metatarsal joint  -Voltaren daily (allergic to meloxicam) -custom orthotics at next appointment   2. Tremory: benign essential tremor, possible alcoholic component -Starting primidone

## 2016-07-07 ENCOUNTER — Other Ambulatory Visit: Payer: Self-pay | Admitting: Physician Assistant

## 2016-07-17 ENCOUNTER — Other Ambulatory Visit: Payer: Self-pay | Admitting: Physician Assistant

## 2016-08-15 ENCOUNTER — Ambulatory Visit (INDEPENDENT_AMBULATORY_CARE_PROVIDER_SITE_OTHER): Payer: BLUE CROSS/BLUE SHIELD | Admitting: Sports Medicine

## 2016-08-15 ENCOUNTER — Encounter: Payer: Self-pay | Admitting: Sports Medicine

## 2016-08-15 DIAGNOSIS — M19071 Primary osteoarthritis, right ankle and foot: Secondary | ICD-10-CM | POA: Diagnosis not present

## 2016-08-15 DIAGNOSIS — M19072 Primary osteoarthritis, left ankle and foot: Secondary | ICD-10-CM | POA: Diagnosis not present

## 2016-08-15 DIAGNOSIS — G250 Essential tremor: Secondary | ICD-10-CM | POA: Diagnosis not present

## 2016-08-15 MED ORDER — PRAVASTATIN SODIUM 20 MG PO TABS: 20.0000 mg | ORAL_TABLET | Freq: Every day | ORAL | 3 refills | Status: AC

## 2016-08-15 NOTE — Assessment & Plan Note (Signed)
Resolved with primidone, continue medications.

## 2016-08-15 NOTE — Assessment & Plan Note (Signed)
Slight improvement with medial cuneiform/first metatarsal pain. He never came back for orthotics, he is going to make an appointment. Continue Voltaren, injection if no better after orthotics.

## 2016-08-15 NOTE — Progress Notes (Signed)
  Subjective:    CC: med refills  HPI: 60 yo M with history of HTN, hyperlipidemia, benign essential tremor, primary OA of both feet presenting for medication refill.  He needs a refill on his pravastatin and naproxen.  He says OA in his feet is gradually improving, but he continues to endorse pain over the medial cuneiform of his bilateral feet at the end of the day.  He says naproxen helps with the pain.  Pain is much improved from 6 months ago.  He also says that primidone has greatly improved his essential tremor.   Past medical history:  Negative.  See flowsheet/record as well for more information.  Surgical history: Negative.  See flowsheet/record as well for more information.  Family history: Negative.  See flowsheet/record as well for more information.  Social history: Negative.  See flowsheet/record as well for more information.  Allergies, and medications have been entered into the medical record, reviewed, and no changes needed.   Review of Systems: No fevers, chills, night sweats, weight loss, chest pain, or shortness of breath.   Objective:    General: Well Developed, well nourished, and in no acute distress.  Neuro: Alert and oriented x3, extra-ocular muscles intact, sensation grossly intact.  HEENT: Normocephalic, atraumatic, pupils equal round reactive to light, neck supple, no masses, no lymphadenopathy, thyroid nonpalpable.  Skin: Warm and dry, no rashes. Cardiac: Regular rate and rhythm, no murmurs rubs or gallops, no lower extremity edema.  Respiratory: Clear to auscultation bilaterally. Not using accessory muscles, speaking in full sentences. L,R Ankle: No visible erythema or swelling. Range of motion is full in all directions. Strength is 5/5 in all directions. Stable lateral and medial ligaments; squeeze test and kleiger test unremarkable; Talar dome nontender; No pain at base of 5th MT; No tenderness over cuboid; No tenderness over N spot or navicular  prominence No tenderness on posterior aspects of lateral and medial malleolus No sign of peroneal tendon subluxations or tenderness to palpation Negative tarsal tunnel tinel's Able to walk 4 steps.   Impression and Recommendations:    1. Hypercholesterinemia: Lipid panel was within normal limits during routine bloodwork in 2016.  Will continue pravastatin 20mg  daily.  -Will refill pravastatin   2. Bilateral OA of both feet: confirmed by X-ray.  Currently using naproxen and voltaren, slowly improving. -Will refill naproxen  -Orthotics

## 2016-08-30 ENCOUNTER — Ambulatory Visit (INDEPENDENT_AMBULATORY_CARE_PROVIDER_SITE_OTHER): Payer: BLUE CROSS/BLUE SHIELD | Admitting: Sports Medicine

## 2016-08-30 ENCOUNTER — Encounter: Payer: Self-pay | Admitting: Sports Medicine

## 2016-08-30 DIAGNOSIS — M19072 Primary osteoarthritis, left ankle and foot: Secondary | ICD-10-CM

## 2016-08-30 DIAGNOSIS — M19071 Primary osteoarthritis, right ankle and foot: Secondary | ICD-10-CM

## 2016-08-30 NOTE — Progress Notes (Signed)

## 2016-08-30 NOTE — Assessment & Plan Note (Signed)
Only minimal improvement, continue with anti-inflammatories. Custom orthotics as above. Return in one month, if insufficient improvement we will proceed with injection at the first metatarsal/medial cuneiform joint.

## 2016-09-27 ENCOUNTER — Other Ambulatory Visit: Payer: Self-pay | Admitting: Physician Assistant

## 2016-10-04 ENCOUNTER — Ambulatory Visit (INDEPENDENT_AMBULATORY_CARE_PROVIDER_SITE_OTHER): Payer: BLUE CROSS/BLUE SHIELD | Admitting: Sports Medicine

## 2016-10-04 ENCOUNTER — Ambulatory Visit (INDEPENDENT_AMBULATORY_CARE_PROVIDER_SITE_OTHER): Payer: BLUE CROSS/BLUE SHIELD

## 2016-10-04 DIAGNOSIS — S99911A Unspecified injury of right ankle, initial encounter: Secondary | ICD-10-CM

## 2016-10-04 DIAGNOSIS — M25471 Effusion, right ankle: Secondary | ICD-10-CM | POA: Diagnosis not present

## 2016-10-04 NOTE — Assessment & Plan Note (Signed)
Suppressing the x-rays are negative for fracture, strapped with compressive dressing, CAM boot. Minimized weightbearing, return to see me in 2 weeks.

## 2016-10-04 NOTE — Progress Notes (Signed)
   Subjective:    I'm seeing this patient as a consultation for:  Mario Planas, PA-C  CC: Right ankle injury  HPI: This is a pleasant 60 year old male, yesterday he tripped and fell into a hole, inverted his right ankle, he had immediate pain, swelling, bruising, difficulty bearing weight. He is here for further evaluation and definitive treatment. Pain is moderate, persistent, localized over the lateral malleolus without radiation.  Past medical history:  Negative.  See flowsheet/record as well for more information.  Surgical history: Negative.  See flowsheet/record as well for more information.  Family history: Negative.  See flowsheet/record as well for more information.  Social history: Negative.  See flowsheet/record as well for more information.  Allergies, and medications have been entered into the medical record, reviewed, and no changes needed.   Review of Systems: No headache, visual changes, nausea, vomiting, diarrhea, constipation, dizziness, abdominal pain, skin rash, fevers, chills, night sweats, weight loss, swollen lymph nodes, body aches, joint swelling, muscle aches, chest pain, shortness of breath, mood changes, visual or auditory hallucinations.   Objective:   General: Well Developed, well nourished, and in no acute distress.  Neuro/Psych: Alert and oriented x3, extra-ocular muscles intact, able to move all 4 extremities, sensation grossly intact. Skin: Warm and dry, no rashes noted.  Respiratory: Not using accessory muscles, speaking in full sentences, trachea midline.  Cardiovascular: Pulses palpable, no extremity edema. Abdomen: Does not appear distended. Right Ankle: Visibly swollen with tenderness over the lateral malleolus Range of motion is full in all directions. Strength is 5/5 in all directions. Stable lateral and medial ligaments; squeeze test and kleiger test unremarkable; Talar dome nontender; No pain at base of 5th MT; No tenderness over cuboid; No  tenderness over N spot or navicular prominence No tenderness on posterior aspects of lateral and medial malleolus No sign of peroneal tendon subluxations; Negative tarsal tunnel tinel's  Ankle was strapped with compressive dressing.  X-ray show no evidence of fracture.  Impression and Recommendations:   This case required medical decision making of moderate complexity.  Ankle injury, right, initial encounter Suppressing the x-rays are negative for fracture, strapped with compressive dressing, CAM boot. Minimized weightbearing, return to see me in 2 weeks.

## 2016-10-06 ENCOUNTER — Other Ambulatory Visit: Payer: Self-pay | Admitting: Physician Assistant

## 2016-10-07 ENCOUNTER — Telehealth: Payer: Self-pay

## 2016-10-07 ENCOUNTER — Encounter: Payer: Self-pay | Admitting: Sports Medicine

## 2016-10-07 NOTE — Telephone Encounter (Signed)
Pt wife called stating pt has not went back to work due to pain and swelling and would like a note to excuse him until Monday. Please assist.

## 2016-10-07 NOTE — Telephone Encounter (Signed)
Letter in box. 

## 2016-10-10 NOTE — Telephone Encounter (Signed)
Notified. 

## 2016-10-18 ENCOUNTER — Ambulatory Visit: Payer: BLUE CROSS/BLUE SHIELD | Admitting: Sports Medicine

## 2016-10-19 ENCOUNTER — Encounter: Payer: Self-pay | Admitting: Sports Medicine

## 2016-10-19 ENCOUNTER — Ambulatory Visit (INDEPENDENT_AMBULATORY_CARE_PROVIDER_SITE_OTHER): Payer: BLUE CROSS/BLUE SHIELD | Admitting: Sports Medicine

## 2016-10-19 DIAGNOSIS — S99911A Unspecified injury of right ankle, initial encounter: Secondary | ICD-10-CM

## 2016-10-19 NOTE — Assessment & Plan Note (Signed)
No visible fractures on x-ray but I do suspect he had a peroneal subluxation with persistent bruising, as well as crepitus with firing of the peroneal. Peroneal tendon sheath injection, continue boot for now.

## 2016-10-19 NOTE — Progress Notes (Signed)
  Subjective:    CC: Follow-up  HPI: Right ankle injury: With swelling and bruising, occurred 2 weeks ago, he has been in a boot, swelling has improved but he continues to have bruising and pain, localized around the lateral malleolus and behind it. X-rays were negative for fracture, pain is moderate, persistent.  Past medical history:  Negative.  See flowsheet/record as well for more information.  Surgical history: Negative.  See flowsheet/record as well for more information.  Family history: Negative.  See flowsheet/record as well for more information.  Social history: Negative.  See flowsheet/record as well for more information.  Allergies, and medications have been entered into the medical record, reviewed, and no changes needed.   Review of Systems: No fevers, chills, night sweats, weight loss, chest pain, or shortness of breath.   Objective:    General: Well Developed, well nourished, and in no acute distress.  Neuro: Alert and oriented x3, extra-ocular muscles intact, sensation grossly intact.  HEENT: Normocephalic, atraumatic, pupils equal round reactive to light, neck supple, no masses, no lymphadenopathy, thyroid nonpalpable.  Skin: Warm and dry, no rashes. Cardiac: Regular rate and rhythm, no murmurs rubs or gallops, no lower extremity edema.  Respiratory: Clear to auscultation bilaterally. Not using accessory muscles, speaking in full sentences. Right Ankle: Still bruised and swollen, crepitus over the peroneal. Range of motion is full in all directions. Strength is 5/5 in all directions. Stable lateral and medial ligaments; squeeze test and kleiger test unremarkable; Talar dome nontender; No pain at base of 5th MT; No tenderness over cuboid; No tenderness over N spot or navicular prominence No tenderness on posterior aspects of lateral and medial malleolus No sign of peroneal tendon subluxations; Negative tarsal tunnel tinel's Able to walk 4 steps.  Procedure:  Real-time Ultrasound Guided Injection of right peroneal tendon sheath Device: GE Logiq E  Verbal informed consent obtained.  Time-out conducted.  Noted no overlying erythema, induration, or other signs of local infection.  Skin prepped in a sterile fashion.  Local anesthesia: Topical Ethyl chloride.  With sterile technique and under real time ultrasound guidance:  Noted tendon sheath effusion in the peroneus brevis tendon sheath, I was unable to see the peroneus longus distal to the tip of the malleolus, question rupture, 25-gauge needle advanced and I injected 1 mL kenalog 40, 1 mL lidocaine, 1 mL Marcaine. Completed without difficulty  Pain immediately resolved suggesting accurate placement of the medication.  Advised to call if fevers/chills, erythema, induration, drainage, or persistent bleeding.  Images permanently stored and available for review in the ultrasound unit.  Impression: Technically successful ultrasound guided injection.  Impression and Recommendations:    Ankle injury, right, initial encounter No visible fractures on x-ray but I do suspect he had a peroneal subluxation with persistent bruising, as well as crepitus with firing of the peroneal. Peroneal tendon sheath injection, continue boot for now.

## 2016-10-23 IMAGING — DX DG FOOT COMPLETE 3+V*R*
3 series · 3 of 3 positions shown · non-contrast
Comparison: None.

CLINICAL DATA: Pain for 3 months.  No history of recent trauma

EXAM:
RIGHT FOOT COMPLETE - 3+ VIEW

[foot ap]
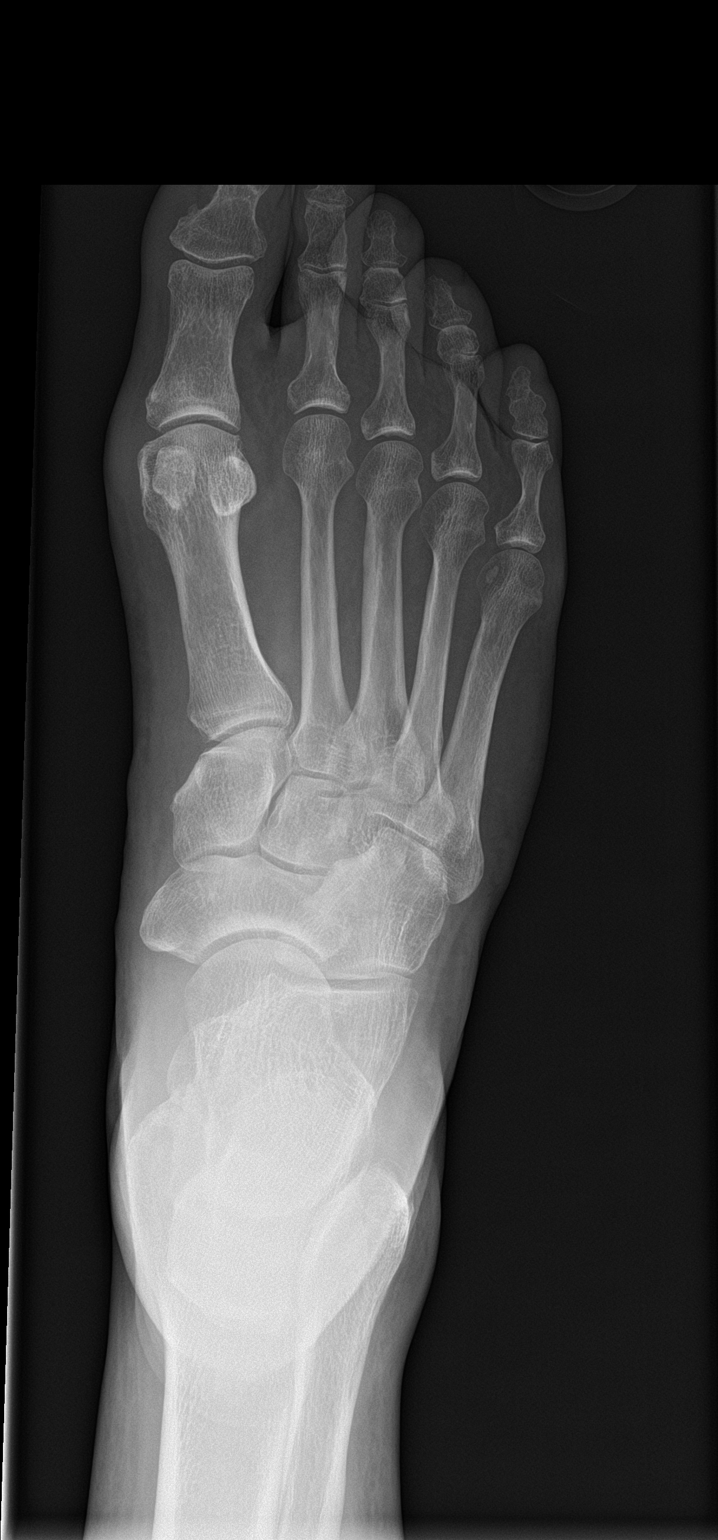

[foot obl]
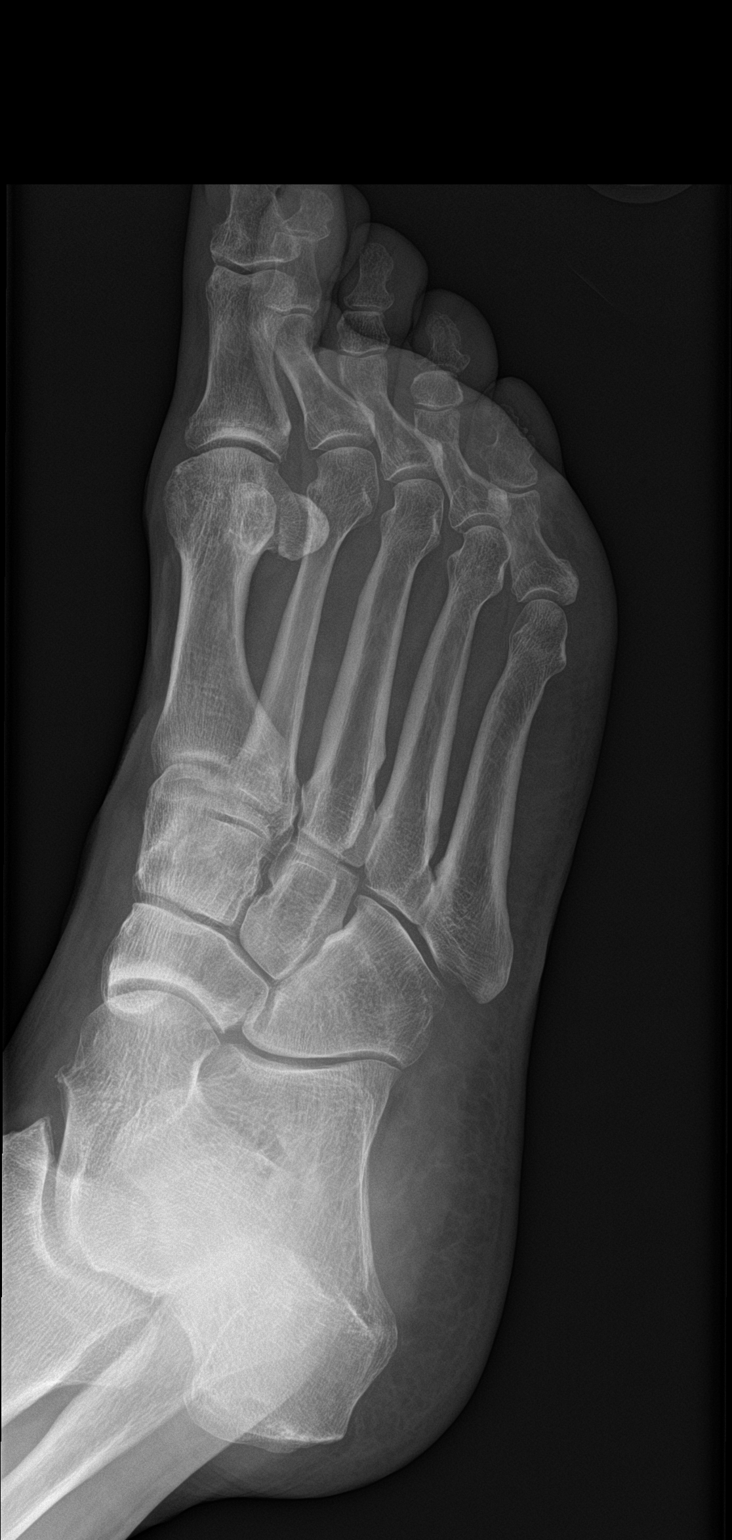

[foot lat]
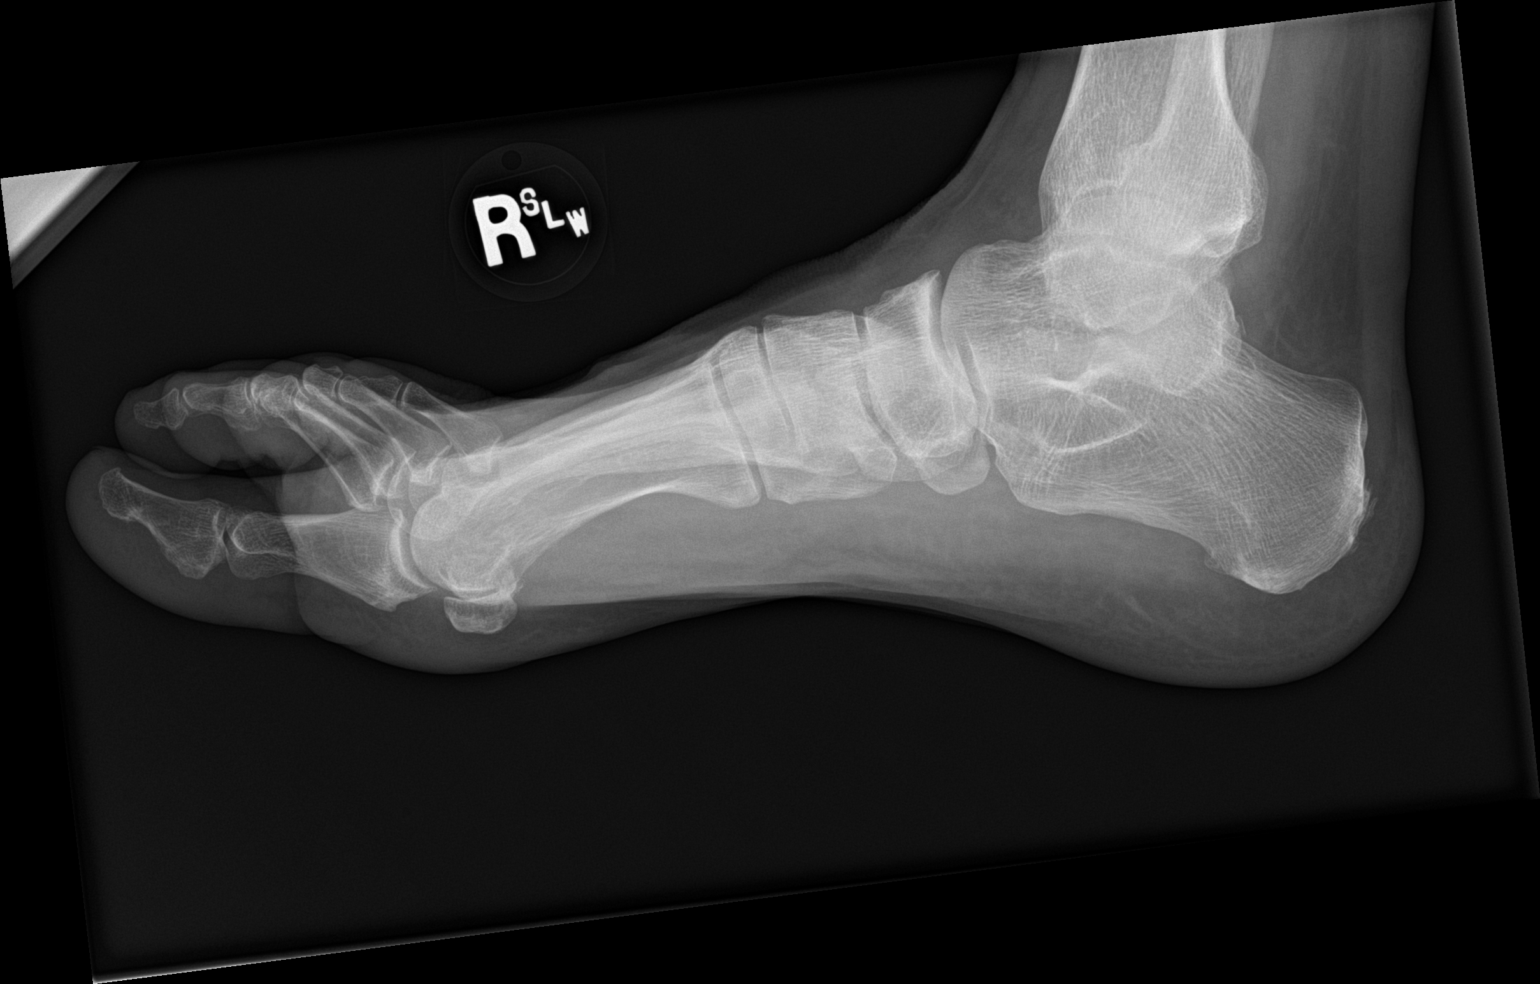

[3 of 3 positions shown; findings below may reference images not displayed]

FINDINGS: Frontal, oblique, and lateral views were obtained. There is no
fracture or dislocation. The joint spaces appear normal. No erosive
change. There is minimal spurring along the inferior and posterior
calcaneus.
IMPRESSION: Minimal calcaneal spurs. No fracture or dislocation. No apparent
arthropathy.

## 2016-10-27 ENCOUNTER — Other Ambulatory Visit: Payer: Self-pay | Admitting: Sports Medicine

## 2016-10-27 DIAGNOSIS — M19072 Primary osteoarthritis, left ankle and foot: Principal | ICD-10-CM

## 2016-10-27 DIAGNOSIS — M19071 Primary osteoarthritis, right ankle and foot: Secondary | ICD-10-CM

## 2016-10-27 DIAGNOSIS — G250 Essential tremor: Secondary | ICD-10-CM

## 2016-11-21 ENCOUNTER — Ambulatory Visit (INDEPENDENT_AMBULATORY_CARE_PROVIDER_SITE_OTHER): Payer: PRIVATE HEALTH INSURANCE | Admitting: Sports Medicine

## 2016-11-21 DIAGNOSIS — S99911A Unspecified injury of right ankle, initial encounter: Secondary | ICD-10-CM | POA: Diagnosis not present

## 2016-11-21 NOTE — Progress Notes (Signed)
  Subjective:    CC: Follow-up  HPI: Peroneal subluxation: Has recovered well with complete pain relief after peroneal tendon sheath injection on the right at the last visit, no further questions.  Past medical history:  Negative.  See flowsheet/record as well for more information.  Surgical history: Negative.  See flowsheet/record as well for more information.  Family history: Negative.  See flowsheet/record as well for more information.  Social history: Negative.  See flowsheet/record as well for more information.  Allergies, and medications have been entered into the medical record, reviewed, and no changes needed.   Review of Systems: No fevers, chills, night sweats, weight loss, chest pain, or shortness of breath.   Objective:    General: Well Developed, well nourished, and in no acute distress.  Neuro: Alert and oriented x3, extra-ocular muscles intact, sensation grossly intact.  HEENT: Normocephalic, atraumatic, pupils equal round reactive to light, neck supple, no masses, no lymphadenopathy, thyroid nonpalpable.  Skin: Warm and dry, no rashes. Cardiac: Regular rate and rhythm, no murmurs rubs or gallops, no lower extremity edema.  Respiratory: Clear to auscultation bilaterally. Not using accessory muscles, speaking in full sentences. Right Ankle: No visible erythema or swelling. Range of motion is full in all directions. Strength is 5/5 in all directions. Stable lateral and medial ligaments; squeeze test and kleiger test unremarkable; Talar dome nontender; No pain at base of 5th MT; No tenderness over cuboid; No tenderness over N spot or navicular prominence No tenderness on posterior aspects of lateral and medial malleolus No sign of peroneal tendon subluxations; Negative tarsal tunnel tinel's Able to walk 4 steps.  Impression and Recommendations:    Ankle injury, right, initial encounter Has recovered well from a peroneal tendon subluxation, we did a peroneal tendon  sheath injection at the last visit and he is completely better. May return to work without restrictions.

## 2016-11-21 NOTE — Assessment & Plan Note (Signed)
Has recovered well from a peroneal tendon subluxation, we did a peroneal tendon sheath injection at the last visit and he is completely better. May return to work without restrictions.
